# Patient Record
Sex: Male | Born: 1985 | Race: White | Hispanic: No | Marital: Married | Smoking: Never smoker
Health system: Southern US, Community
[De-identification: ages and names within clinical notes are randomized; demographics above are authoritative.]

## PROBLEM LIST (undated history)

## (undated) HISTORY — PX: APPENDECTOMY: SHX54

---
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
## 2006-01-28 IMAGING — CT CT ABDOMEN W/ CM
1 of 4 series · 14 of 32 positions shown, 19 images · IV contrast (omnipaque)
Comparison: none

CLINICAL DATA: The patient has nausea, vomiting, some abdominal pain.  Was operated on for ruptured appendix two weeks ago; now having diarrhea as well as nausea and vomiting.  
 CT ABDOMEN WITH CONTRAST
 After the intravenous injection of 150 ml Omnipaque 300, a series of scans through the entire abdomen is made and shows the lower lung fields to be normal.  The liver, gallbladder, hepatic radicles, common bile duct, pancreas and spleen appear normal.  The adrenal glands and kidneys appear normal.  No free fluid or air is seen within the abdomen.  The oral contrast is passed throughout the stomach and small bowel into the right colon.  Bones of the lower thoracic and upper lumbar spine are normal.
 IMPRESSION
 Normal CT scan of the abdomen with contrast.
 CT PELVIS WITH CONTRAST
 Utilizing the same contrast material as used for the abdominal scan, a series of scans of the pelvis are made and show rather marked thickening of the cecum.  Some areas are thickened up to 2 cm in maximum thickness.  No definite abscess, free fluid or air is seen within the pelvis but this focal colon thickening suggests a significant focal colitis in the region of the cecum.  The transverse and left colon do not appear to be significantly thickened.  The rectum and sigmoid show only minimal thickening.  The bladder, prostate and seminal vesicles appear normal.  The bones of the pelvis and lower lumbar spine are normal.
 Focal marked increase in thickening of the cecal wall, suggestive of focal colitis.  There is minimal thickening associated with the left and sigmoid colon.  No free fluid, air or focal abscess is seen.

[Series 2: abd/pelvis 5.0 b30f · axial · 0.59mm/px · z∈[-489,-84]mm · 14 of 91 slices shown, 19 images]
[im 5/91  soft-tissue]
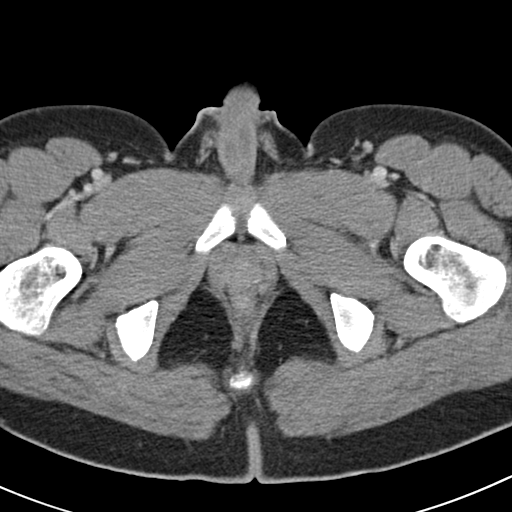
[im 5/91  bone]
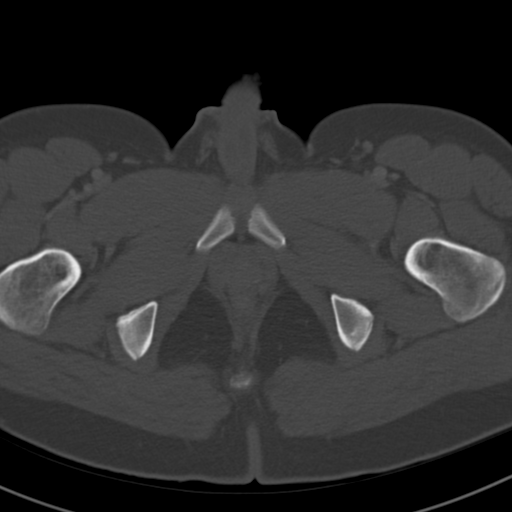
[im 15/91  soft-tissue]
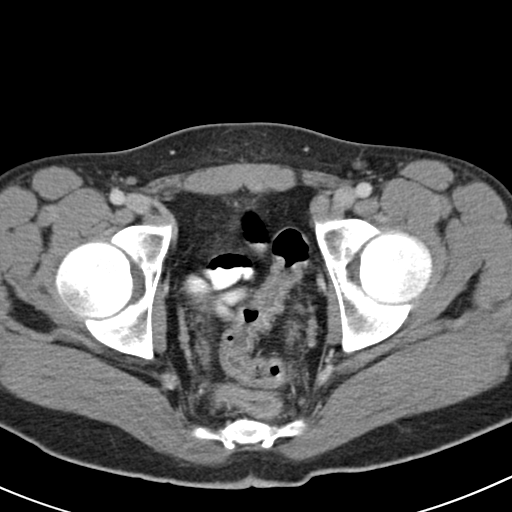
[im 19/91  soft-tissue]
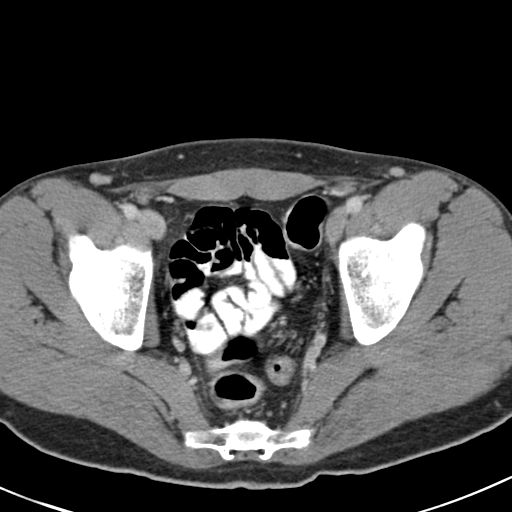
[im 24/91  soft-tissue]
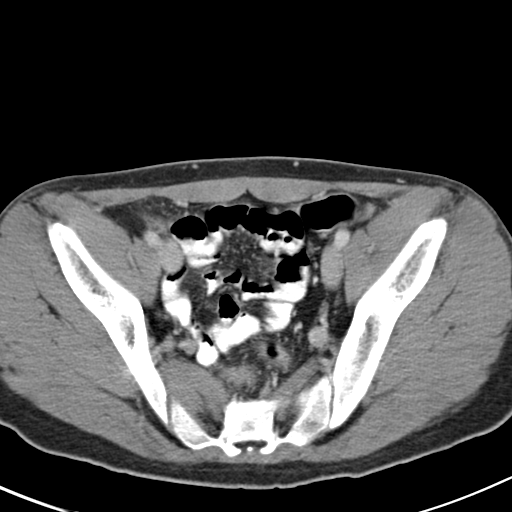
[im 34/91  soft-tissue]
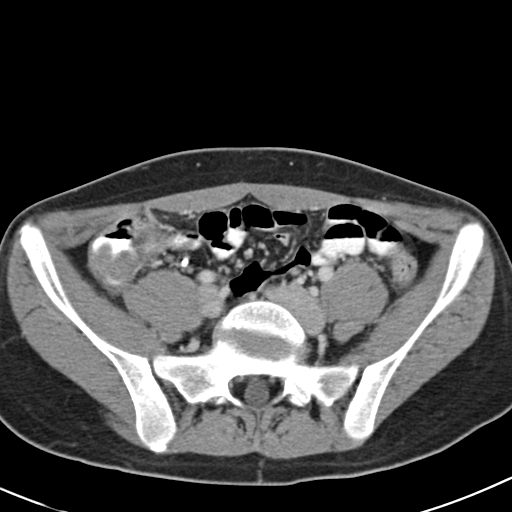
[im 38/91  soft-tissue]
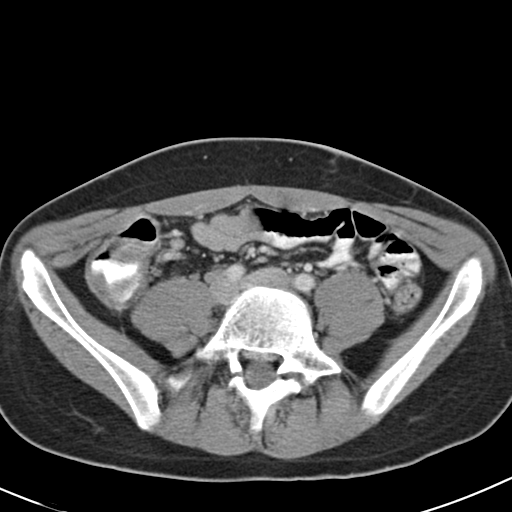
[im 48/91  soft-tissue]
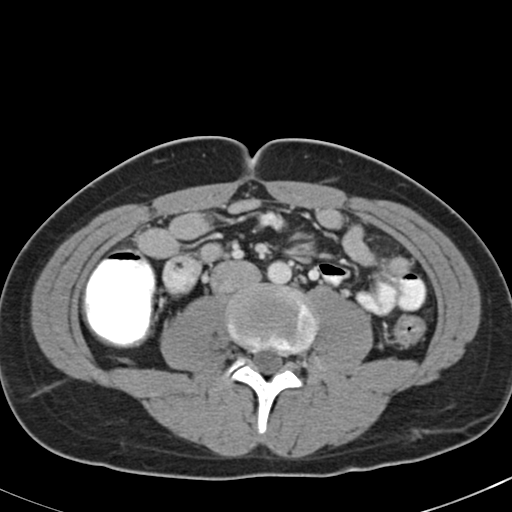
[im 53/91  soft-tissue]
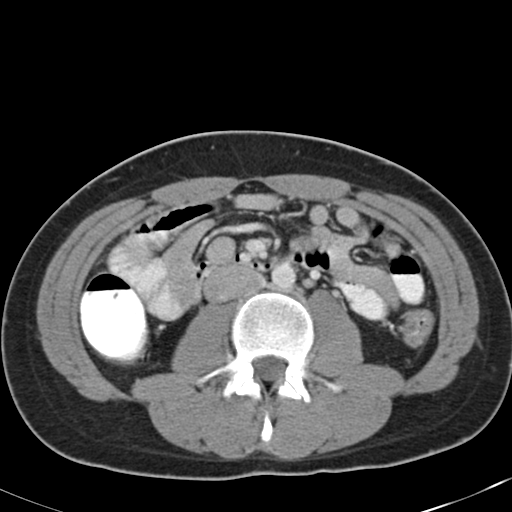
[im 57/91  soft-tissue]
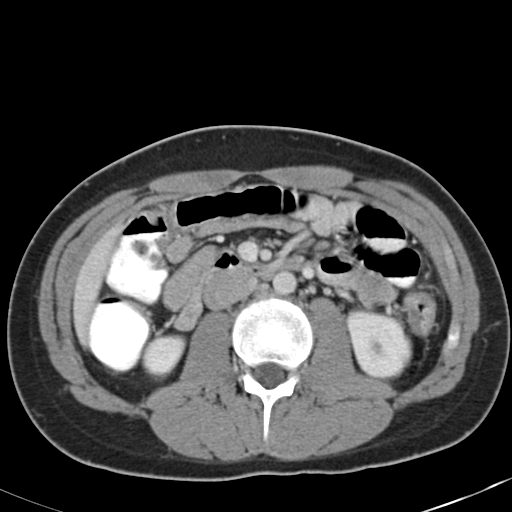
[im 57/91  bone]
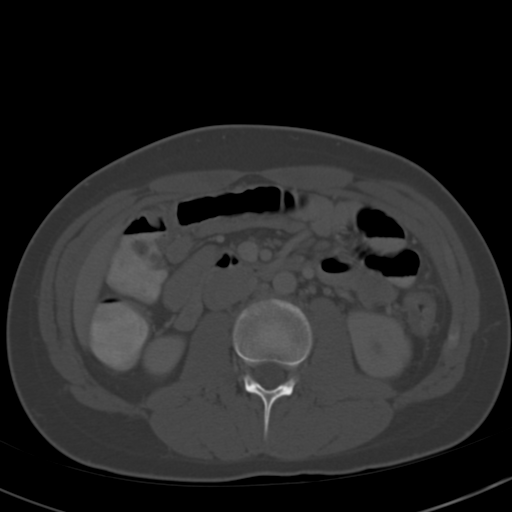
[im 67/91  soft-tissue]
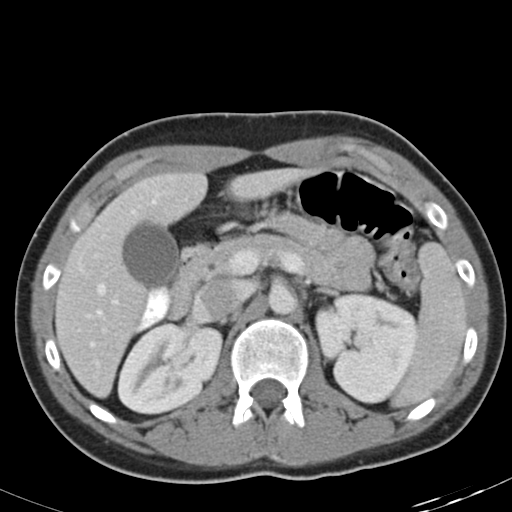
[im 72/91  soft-tissue]
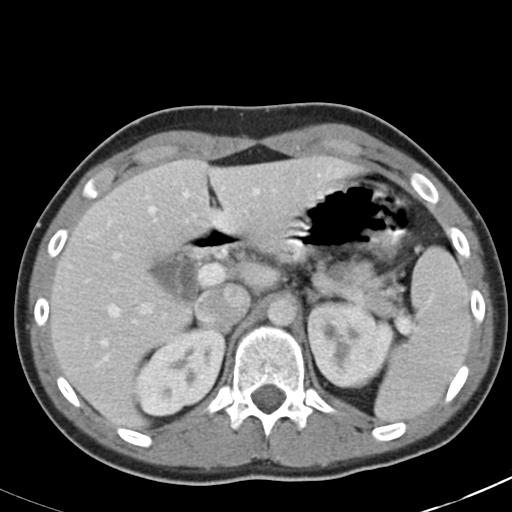
[im 72/91  lung]
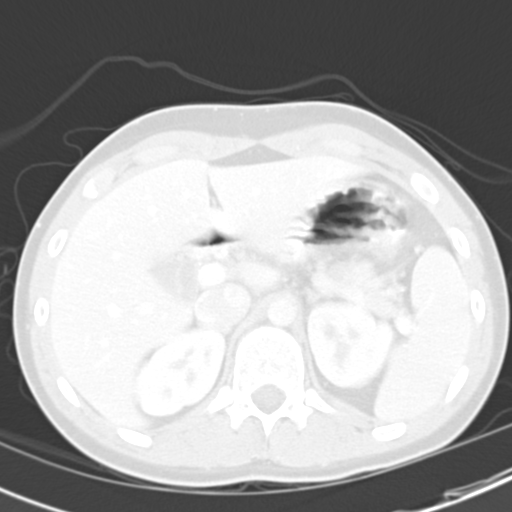
[im 76/91  soft-tissue]
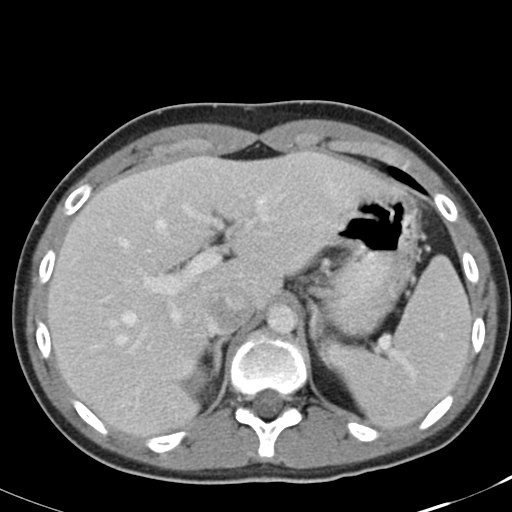
[im 76/91  lung]
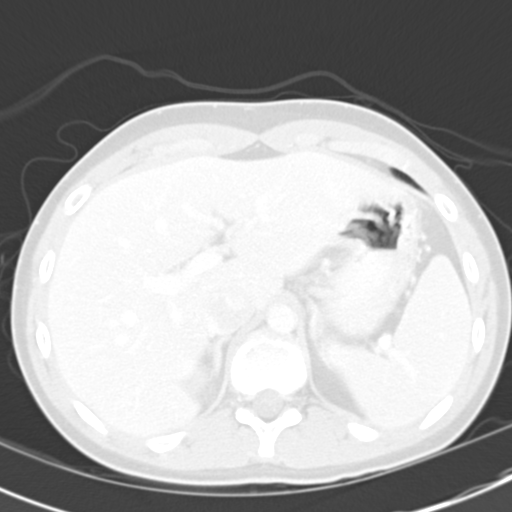
[im 81/91  lung]
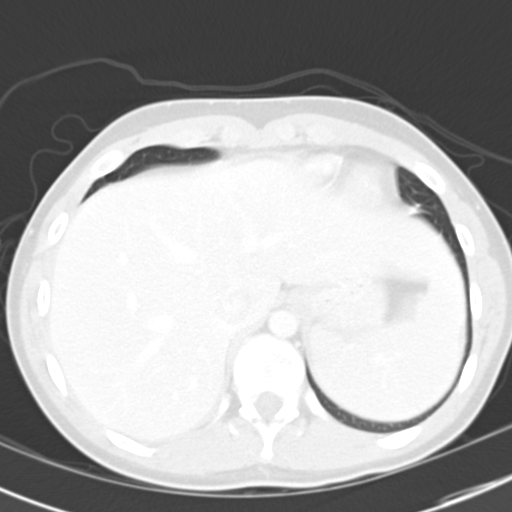
[im 86/91  soft-tissue]
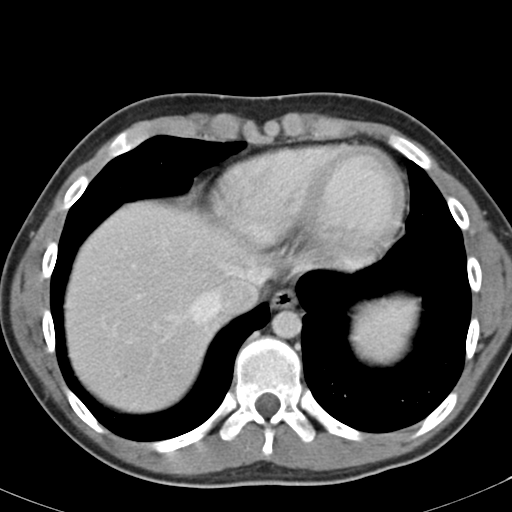
[im 86/91  lung]
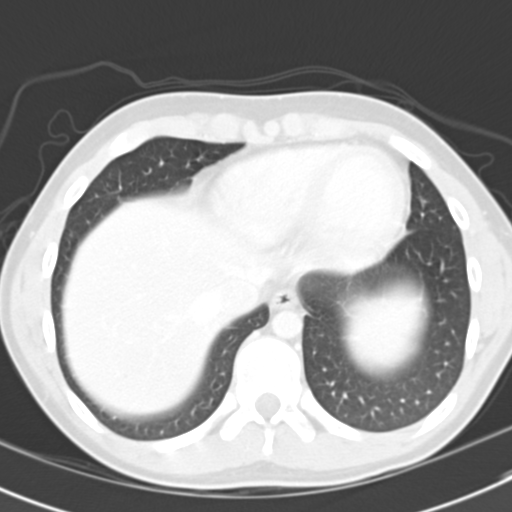

[14 of 32 positions shown; findings below may reference images not displayed]

## 2016-11-10 ENCOUNTER — Emergency Department (HOSPITAL_COMMUNITY)
Admission: EM | Admit: 2016-11-10 | Discharge: 2016-11-10 | Disposition: A | Discharge status: Home / Self Care | Payer: 59 | Attending: Emergency Medicine | Admitting: Emergency Medicine

## 2016-11-10 ENCOUNTER — Encounter (HOSPITAL_COMMUNITY): Payer: Self-pay

## 2016-11-10 DIAGNOSIS — R278 Other lack of coordination: Secondary | ICD-10-CM

## 2016-11-10 DIAGNOSIS — T887XXA Unspecified adverse effect of drug or medicament, initial encounter: Secondary | ICD-10-CM | POA: Insufficient documentation

## 2016-11-10 DIAGNOSIS — Y658 Other specified misadventures during surgical and medical care: Secondary | ICD-10-CM | POA: Diagnosis not present

## 2016-11-10 DIAGNOSIS — R270 Ataxia, unspecified: Secondary | ICD-10-CM | POA: Diagnosis present

## 2016-11-10 DIAGNOSIS — T50905A Adverse effect of unspecified drugs, medicaments and biological substances, initial encounter: Secondary | ICD-10-CM

## 2016-11-10 LAB — I-STAT CHEM 8, ED
BUN: 11 mg/dL (ref 6–20)
CALCIUM ION: 1.15 mmol/L (ref 1.15–1.40)
CHLORIDE: 101 mmol/L (ref 101–111)
Creatinine, Ser: 1 mg/dL (ref 0.61–1.24)
Glucose, Bld: 93 mg/dL (ref 65–99)
HEMATOCRIT: 45 % (ref 39.0–52.0)
Hemoglobin: 15.3 g/dL (ref 13.0–17.0)
Potassium: 4.3 mmol/L (ref 3.5–5.1)
SODIUM: 140 mmol/L (ref 135–145)
TCO2: 28 mmol/L (ref 0–100)

## 2016-11-10 LAB — COMPREHENSIVE METABOLIC PANEL
ALBUMIN: 4 g/dL (ref 3.5–5.0)
ALK PHOS: 59 U/L (ref 38–126)
ALT: 23 U/L (ref 17–63)
ANION GAP: 6 (ref 5–15)
AST: 18 U/L (ref 15–41)
BILIRUBIN TOTAL: 0.4 mg/dL (ref 0.3–1.2)
BUN: 12 mg/dL (ref 6–20)
CO2: 27 mmol/L (ref 22–32)
Calcium: 9.1 mg/dL (ref 8.9–10.3)
Chloride: 105 mmol/L (ref 101–111)
Creatinine, Ser: 0.99 mg/dL (ref 0.61–1.24)
GFR calc Af Amer: >60 mL/min (ref >60)
GFR calc non Af Amer: >60 mL/min (ref >60)
GLUCOSE: 97 mg/dL (ref 65–99)
POTASSIUM: 4.3 mmol/L (ref 3.5–5.1)
SODIUM: 138 mmol/L (ref 135–145)
TOTAL PROTEIN: 8 g/dL (ref 6.5–8.1)

## 2016-11-10 LAB — CBC WITH DIFFERENTIAL/PLATELET
Basophils Absolute: 0 10*3/uL (ref 0.0–0.1)
Basophils Relative: 0 %
EOS PCT: 3 %
Eosinophils Absolute: 0.2 10*3/uL (ref 0.0–0.7)
HEMATOCRIT: 42.7 % (ref 39.0–52.0)
Hemoglobin: 15.4 g/dL (ref 13.0–17.0)
LYMPHS ABS: 2.6 10*3/uL (ref 0.7–4.0)
LYMPHS PCT: 30 %
MCH: 30.3 pg (ref 26.0–34.0)
MCHC: 36.1 g/dL — AB (ref 30.0–36.0)
MCV: 84.1 fL (ref 78.0–100.0)
MONO ABS: 0.7 10*3/uL (ref 0.1–1.0)
MONOS PCT: 8 %
NEUTROS ABS: 5.2 10*3/uL (ref 1.7–7.7)
Neutrophils Relative %: 59 %
PLATELETS: 275 10*3/uL (ref 150–400)
RBC: 5.08 MIL/uL (ref 4.22–5.81)
RDW: 13.4 % (ref 11.5–15.5)
WBC: 8.7 10*3/uL (ref 4.0–10.5)

## 2016-11-10 NOTE — Discharge Instructions (Signed)
Please seek additional medical care if you have any worsening symptoms or have any additional concerns.  Please follow up with your doctor regarding today's ED visit.

## 2016-11-10 NOTE — ED Provider Notes (Signed)
[facility]-EMERGENCY DEPT Provider Note   CSN: [id number] Arrival date & time: 11/10/16  1712     History   Chief Complaint Chief Complaint  Patient presents with  . Medication Reaction    HPI Douglas Ortega is a 31 y.o. male who presents with weakness, feeling uncoordinated and reported abnormal eye movements after starting bactrim for a lump on his scrotum.  He reports that He started the medication on Saturday, stopped it on Sunday after he developed symptoms and has continued to experience symptoms since.  He reports that he does not have any neck or back pain.  Mild headache.  He denies passing out, n/v, rashes. He reports some mild tingling in the tips of his fingers bilaterally. He reports that in his legs his symptoms only occur from the knee down.    He reports that he has been told that there is a condition called "drunken limbs syndrome" that can be a rare complication from Bactrim. He believes he has this symptom and is concerned that he is not getting better.  He already has a new prescription for Keflex to treat his original resumed infection, however he states that he has not started that at this time.  He is present today with his wife who assists in providing history.  HPI  History reviewed. No pertinent past medical history.  There are no active problems to display for this patient.   No past surgical history on file.     Home Medications    Prior to Admission medications   Not on File    Family History History reviewed. No pertinent family history.  Social History Social History  Substance Use Topics  . Smoking status: Not on file  . Smokeless tobacco: Not on file  . Alcohol use Not on file     Allergies   Patient has no known allergies.   Review of Systems Review of Systems  Constitutional: Negative for chills and fever.  HENT: Negative for congestion, ear pain, nosebleeds and sore throat.   Eyes: Negative for pain and visual disturbance.    Respiratory: Negative for cough and shortness of breath.   Cardiovascular: Negative for chest pain and palpitations.  Gastrointestinal: Negative for abdominal pain, nausea and vomiting.  Genitourinary: Positive for scrotal swelling (Reports is getting better ). Negative for dysuria and hematuria.  Musculoskeletal: Positive for gait problem. Negative for arthralgias, back pain, joint swelling, neck pain and neck stiffness.  Skin: Negative for color change and rash.  Neurological: Positive for weakness (Arms and legs), numbness (Tips of fingers) and headaches. Negative for seizures and syncope.  All other systems reviewed and are negative.    Physical Exam Updated Vital Signs BP 132/84   Pulse 78   Temp 98.1 F (36.7 C) (Oral)   Resp 14   SpO2 96%   Physical Exam  Constitutional: He is oriented to person, place, and time. He appears well-developed and well-nourished.  HENT:  Head: Normocephalic and atraumatic.  Eyes: Conjunctivae are normal.  Neck: Neck supple.  Cardiovascular: Normal rate and regular rhythm.   No murmur heard. Pulmonary/Chest: Effort normal and breath sounds normal. No respiratory distress.  Abdominal: Soft. There is no tenderness.  Musculoskeletal: He exhibits no edema.  Neurological: He is alert and oriented to person, place, and time.  Mental Status:  Alert, oriented, thought content appropriate, able to give a coherent history. Speech fluent without evidence of aphasia. Able to follow 2 step commands without difficulty.  Cranial Nerves:  II:  Peripheral visual fields grossly normal, pupils equal, round, reactive to light III,IV, VI: ptosis not present, extra-ocular motions intact bilaterally , no abnormal nystagmus V,VII: smile symmetric, facial light touch sensation equal VIII: hearing grossly normal to voice  X: uvula elevates symmetrically  XI: bilateral shoulder shrug symmetric and strong XII: midline tongue extension without fassiculations Motor:  5/5 in upper and lower extremities bilaterally including strong and equal grip strength and dorsiflexion/plantar flexion Cerebellar: normal finger-to-nose with bilateral upper extremities, normal heel from toe to knee bilaterally, normal rapid alternating movements. Gait: gait is ataxic, more with right leg than left.  CV: distal pulses palpable throughout    Skin: Skin is warm and dry.  Psychiatric: He has a normal mood and affect.  Nursing note and vitals reviewed.   RN reports that she observed patient walking into the room without any obvious difficulties or ataxia.  ED Treatments / Results  Labs (all labs ordered are listed, but only abnormal results are displayed) Labs Reviewed  CBC WITH DIFFERENTIAL/PLATELET - Abnormal; Notable for the following:       Result Value   MCHC 36.1 (*)    All other components within normal limits  COMPREHENSIVE METABOLIC PANEL  I-STAT CHEM 8, ED    EKG  EKG Interpretation None       Radiology No results found.  Procedures Procedures (including critical care time)  Medications Ordered in ED Medications - No data to display   Initial Impression / Assessment and Plan / ED Course  I have reviewed the triage vital signs and the nursing notes.  Pertinent labs & imaging results that were available during my care of the patient were reviewed by me and considered in my medical decision making (see chart for details).    Marzetta MerinoBrandon K Witt presents with concern of continued reaction to Bactrim. He started taking Bactrim on Saturday for a potentially infected scrotum, and reports that Sunday he woke up at 3 AM feeling weak, uncoordinated. He states that he has stopped taking the medication however the symptoms continued.  On my exam he has no focal neuro deficits until asked to walk when he appears to have an ataxic gait. Nursing staff documented and reported that he has been observed walking multiple times without obvious staggering, stumbling,  with a normal gait. Due to reported weakness, baseline labs were obtained without evidence of renal dysfunction or electrolyte imbalance.   Dr. Criss AlvineGoldston was involved in the patient's care and I asked him to see the patient. After evaluating the patient he agreed with baseline labs, discharge with outpatient neurology follow-up the patient's symptoms persist.   At this time there does not appear to be any evidence of an acute emergency medical condition and the patient appears stable for discharge with appropriate outpatient follow up.Diagnosis was discussed with patient who verbalizes understanding and is agreeable to discharge. Pt case discussed with Dr. Criss AlvineGoldston who agrees with my plan.    Final Clinical Impressions(s) / ED Diagnoses   Final diagnoses:  Adverse effect of drug, initial encounter  Uncoordinated movements    New Prescriptions There are no discharge medications for this patient.    Cristina GongHammond, Kalifa Cadden W, PA-C 11/11/16 0301    Pricilla LovelessGoldston, Scott, MD 11/11/16 [phone]

## 2016-11-10 NOTE — ED Triage Notes (Addendum)
Pt reports starting a sulfa drug on Saturday that was prescribed at Urgent Care for chafed skin and a lump on his scrotum. He reports that the drug is making him feel weak and uncoordinated. He states that he stopped taking the medication on Sunday, but the symptoms continue. A&Ox4. Ambulatory. In no distress in triage. Pt took benadryl PTA.

## 2019-04-12 ENCOUNTER — Encounter: Payer: Self-pay | Admitting: Family Medicine

## 2019-04-12 ENCOUNTER — Ambulatory Visit: Payer: 59 | Admitting: Family Medicine

## 2019-04-12 ENCOUNTER — Other Ambulatory Visit: Payer: Self-pay

## 2019-04-12 VITALS — BP 110/72 | HR 87 | Temp 98.0°F | Ht 71.0 in | Wt 374.8 lb

## 2019-04-12 DIAGNOSIS — R0681 Apnea, not elsewhere classified: Secondary | ICD-10-CM

## 2019-04-12 DIAGNOSIS — K2100 Gastro-esophageal reflux disease with esophagitis, without bleeding: Secondary | ICD-10-CM

## 2019-04-12 DIAGNOSIS — R1110 Vomiting, unspecified: Secondary | ICD-10-CM | POA: Diagnosis not present

## 2019-04-12 DIAGNOSIS — Z0000 Encounter for general adult medical examination without abnormal findings: Secondary | ICD-10-CM | POA: Diagnosis not present

## 2019-04-12 DIAGNOSIS — Z6841 Body Mass Index (BMI) 40.0 and over, adult: Secondary | ICD-10-CM

## 2019-04-12 DIAGNOSIS — E559 Vitamin D deficiency, unspecified: Secondary | ICD-10-CM

## 2019-04-12 LAB — URINALYSIS, ROUTINE W REFLEX MICROSCOPIC
Bilirubin Urine: NEGATIVE
Hgb urine dipstick: NEGATIVE
Ketones, ur: NEGATIVE
Leukocytes,Ua: NEGATIVE
Nitrite: NEGATIVE
RBC / HPF: NONE SEEN (ref 0–?)
Specific Gravity, Urine: 1.02 (ref 1.000–1.030)
Total Protein, Urine: NEGATIVE
Urine Glucose: NEGATIVE
Urobilinogen, UA: 0.2 (ref 0.0–1.0)
WBC, UA: NONE SEEN (ref 0–?)
pH: 7.5 (ref 5.0–8.0)

## 2019-04-12 LAB — LDL CHOLESTEROL, DIRECT: Direct LDL: 103 mg/dL

## 2019-04-12 LAB — HEMOGLOBIN A1C: Hgb A1c MFr Bld: 4.9 % (ref 4.6–6.5)

## 2019-04-12 LAB — CBC
HCT: 42.9 % (ref 39.0–52.0)
Hemoglobin: 14.5 g/dL (ref 13.0–17.0)
MCHC: 33.8 g/dL (ref 30.0–36.0)
MCV: 84.4 fl (ref 78.0–100.0)
Platelets: 276 10*3/uL (ref 150.0–400.0)
RBC: 5.09 Mil/uL (ref 4.22–5.81)
RDW: 14 % (ref 11.5–15.5)
WBC: 8.4 10*3/uL (ref 4.0–10.5)

## 2019-04-12 LAB — LIPID PANEL
Cholesterol: 153 mg/dL (ref 0–200)
HDL: 34 mg/dL — ABNORMAL LOW (ref >39.00)
LDL Cholesterol: 101 mg/dL — ABNORMAL HIGH (ref 0–99)
NonHDL: 118.83
Total CHOL/HDL Ratio: 4
Triglycerides: 87 mg/dL (ref 0.0–149.0)
VLDL: 17.4 mg/dL (ref 0.0–40.0)

## 2019-04-12 LAB — COMPREHENSIVE METABOLIC PANEL
ALT: 28 U/L (ref 0–53)
AST: 18 U/L (ref 0–37)
Albumin: 3.9 g/dL (ref 3.5–5.2)
Alkaline Phosphatase: 70 U/L (ref 39–117)
BUN: 10 mg/dL (ref 6–23)
CO2: 30 mEq/L (ref 19–32)
Calcium: 9.1 mg/dL (ref 8.4–10.5)
Chloride: 103 mEq/L (ref 96–112)
Creatinine, Ser: 0.88 mg/dL (ref 0.40–1.50)
GFR: 100.1 mL/min (ref >60.00)
Glucose, Bld: 97 mg/dL (ref 70–99)
Potassium: 4.4 mEq/L (ref 3.5–5.1)
Sodium: 139 mEq/L (ref 135–145)
Total Bilirubin: 0.6 mg/dL (ref 0.2–1.2)
Total Protein: 7.4 g/dL (ref 6.0–8.3)

## 2019-04-12 LAB — VITAMIN D 25 HYDROXY (VIT D DEFICIENCY, FRACTURES): VITD: 21.23 ng/mL — ABNORMAL LOW (ref 30.00–100.00)

## 2019-04-12 LAB — TSH: TSH: 2.09 u[IU]/mL (ref 0.35–4.50)

## 2019-04-12 NOTE — Progress Notes (Addendum)
New Patient Office Visit  Subjective:  Patient ID: Douglas Ortega, male    DOB: 1986-03-21  Age: 33 y.o. MRN: [id number]  CC:  Chief Complaint  Patient presents with  . New Patient (Initial Visit)  . Nausea    for the past x 6 months  . Emesis    off and on for the past x 6 months   . Cough    unsure if it has to do with the vomtting     HPI Douglas Ortega presents for establishment of care, complete physical exam in follow-up of a significant health issue that he has been experiencing over the last year or so.  Patient reports nausea with dry heaves with most any kind of movement.  It can happen when he bends over to pick something up, is walking, carrying something or turning.  Specifically denies the sensation of spinning movement or lightheadedness.  The symptoms are worse in the morning when he arises.  He sometimes feels a sinking feeling in his chest.  He denies night sweats or weight loss.  History of reflux that is controlled with over-the-counter Nexium.  He does not smoke use illicit drugs and rarely drinks.  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  Patient has always been overweight but it is gotten worse more recently.  It is difficult for him to exercise with the above that he has been experiencing.  He lives with his wife and 48-year-old daughter.  They are expecting a baby any day now.  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  Denies particular stress or depression in his life.  Admits to snoring and does not feel rested in the morning.  Father's health history is unknown.  Mom is in her early 67s and has some type of heart arrhythmia.  History reviewed. No pertinent past medical history.    History reviewed. No pertinent family history.  Social History   Socioeconomic History    . Marital status: Married    Spouse name: Not on file  . Number of children: Not on file  . Years of education: Not on file  . Highest education level: Not on file  Occupational History  . Not on file  Tobacco Use  . Smoking status: Never Smoker  . Smokeless tobacco: Never Used  Substance and Sexual Activity  . Alcohol use: Yes    Comment: x 1 month   . Drug use: Never  . Sexual activity: Yes  Other Topics Concern  . Not on file  Social History Narrative  . Not on file   Social Determinants of Health   Financial Resource Strain:   . Difficulty of Paying Living Expenses: Not on file  Food Insecurity:   . Worried About Charity fundraiser in the Last Year: Not on file  . Ran Out of Food in the Last Year: Not on file  Transportation Needs:   . Lack of Transportation (Medical): Not on file  . Lack of Transportation (Non-Medical): Not on file  Physical Activity:   . Days of Exercise per Week: Not on file  . Minutes of Exercise per Session: Not on file  Stress:   . Feeling of Stress : Not on file  Social Connections:   . Frequency of Communication with Friends and  Family: Not on file  . Frequency of Social Gatherings with Friends and Family: Not on file  . Attends Religious Services: Not on file  . Active Member of Clubs or Organizations: Not on file  . Attends Archivist Meetings: Not on file  . Marital Status: Not on file  Intimate Partner Violence:   . Fear of Current or Ex-Partner: Not on file  . Emotionally Abused: Not on file  . Physically Abused: Not on file  . Sexually Abused: Not on file    ROS Review of Systems  Constitutional: Negative for chills, diaphoresis, fatigue, fever and unexpected weight change.  HENT: Negative.   Eyes: Negative for photophobia and visual disturbance.  Respiratory: Positive for apnea. Negative for chest tightness, shortness of breath and wheezing.   Cardiovascular: Negative for chest pain, palpitations and leg swelling.   Gastrointestinal: Positive for nausea and vomiting. Negative for abdominal pain, anal bleeding, blood in stool, constipation, diarrhea and rectal pain.  Endocrine: Negative.   Genitourinary: Negative for difficulty urinating and frequency.  Musculoskeletal: Negative.   Skin: Negative for pallor and rash.  Allergic/Immunologic: Negative for immunocompromised state.  Neurological: Negative for dizziness, light-headedness, numbness and headaches.  Hematological: Does not bruise/bleed easily.  Psychiatric/Behavioral: Negative.     Objective:   Today's Vitals: BP 110/72   Pulse 87   Temp 98 F (36.7 C)   Ht 5' 11"  (1.803 m)   Wt (!) 374 lb 12.8 oz (170 kg)   SpO2 97%   BMI 52.27 kg/m   Physical Exam Constitutional:      General: He is not in acute distress.    Appearance: Normal appearance. He is obese. He is not ill-appearing, toxic-appearing or diaphoretic.  HENT:     Head: Normocephalic and atraumatic.     Right Ear: Tympanic membrane, ear canal and external ear normal. There is no impacted cerumen.     Left Ear: Tympanic membrane, ear canal and external ear normal. There is no impacted cerumen.     Nose: No congestion or rhinorrhea.     Mouth/Throat:     Mouth: Mucous membranes are moist.     Pharynx: Oropharynx is clear. No oropharyngeal exudate or posterior oropharyngeal erythema.  Eyes:     General: No scleral icterus.       Right eye: No discharge.        Left eye: No discharge.     Extraocular Movements: Extraocular movements intact.     Conjunctiva/sclera: Conjunctivae normal.     Pupils: Pupils are equal, round, and reactive to light.  Cardiovascular:     Rate and Rhythm: Normal rate and regular rhythm.  Pulmonary:     Effort: Pulmonary effort is normal.     Breath sounds: Normal breath sounds.  Abdominal:     General: Bowel sounds are normal. There is no distension.     Palpations: Abdomen is soft. There is no mass.     Tenderness: There is no abdominal  tenderness. There is no guarding or rebound.     Hernia: No hernia is present. There is no hernia in the left inguinal area or right inguinal area.  Genitourinary:    Penis: Normal. No tenderness, discharge or swelling.      Testes:        Right: Mass, tenderness or swelling not present. Right testis is descended.        Left: Mass, tenderness or swelling not present. Left testis is descended.  Musculoskeletal:  General: No tenderness or deformity.     Cervical back: No rigidity or tenderness.  Lymphadenopathy:     Cervical: No cervical adenopathy.     Lower Body: No right inguinal adenopathy. No left inguinal adenopathy.  Skin:    General: Skin is warm and dry.  Neurological:     Mental Status: He is alert and oriented to person, place, and time.  Psychiatric:        Mood and Affect: Mood normal.        Behavior: Behavior normal.     Assessment & Plan:   Problem List Items Addressed This Visit      Digestive   Gastroesophageal reflux disease with esophagitis     Other   Apnea - Primary   Relevant Orders   Ambulatory referral to Sleep Studies   Dry heaves   Relevant Orders   Ambulatory referral to Neurology   Class 3 severe obesity due to excess calories with body mass index (BMI) of 50.0 to 59.9 in adult [facility])   Relevant Orders   VITAMIN D 25 Hydroxy (Vit-D Deficiency, Fractures) (Completed)   Amb ref to [place] maintenance   Relevant Orders   CBC (Completed)   Comp Met (CMET) (Completed)   Direct LDL (Completed)   HgB A1c (Completed)   Lipid Profile (Completed)   TSH (Completed)   Urinalysis, Routine w reflex microscopic (Completed)   HIV antibody (with reflex) (Completed)   Vitamin D deficiency   Relevant Medications   Cholecalciferol (VITAMIN D3) 75 MCG (3000 UT) TABS      Outpatient Encounter Medications as of 04/12/2019  Medication Sig  . Esomeprazole Magnesium (NEXIUM PO) Take by mouth.  . Cholecalciferol  (VITAMIN D3) 75 MCG (3000 UT) TABS Take one daily   No facility-administered encounter medications on file as of 04/12/2019.    Follow-up: Return in about 3 months (around 07/11/2019).   Libby Maw, MD

## 2019-04-13 DIAGNOSIS — E559 Vitamin D deficiency, unspecified: Secondary | ICD-10-CM | POA: Insufficient documentation

## 2019-04-13 LAB — HIV ANTIBODY (ROUTINE TESTING W REFLEX): HIV 1&2 Ab, 4th Generation: NONREACTIVE

## 2019-04-13 MED ORDER — VITAMIN D3 75 MCG (3000 UT) PO TABS: ORAL_TABLET | ORAL | 1 refills | Status: AC

## 2019-04-13 NOTE — Addendum Note (Signed)
Addended by: Jon Billings on: 04/13/2019 11:31 AM   Modules accepted: Orders

## 2019-04-13 NOTE — Progress Notes (Signed)
Labs were okay but Vit D was low. Treating may help some with weight loss. Have sent a Rx to the pharmacy. We willrecheck on follow up.

## 2019-05-18 ENCOUNTER — Ambulatory Visit: Payer: 59 | Admitting: Registered"

## 2019-05-31 ENCOUNTER — Ambulatory Visit: Payer: 59 | Admitting: Dietician

## 2019-06-21 ENCOUNTER — Ambulatory Visit: Payer: 59 | Admitting: Dietician

## 2019-07-11 ENCOUNTER — Ambulatory Visit: Payer: 59 | Admitting: Family Medicine

## 2019-07-11 DIAGNOSIS — Z0289 Encounter for other administrative examinations: Secondary | ICD-10-CM

## 2020-03-16 ENCOUNTER — Ambulatory Visit: Payer: 59 | Attending: Internal Medicine

## 2020-03-16 DIAGNOSIS — Z23 Encounter for immunization: Secondary | ICD-10-CM

## 2020-03-16 NOTE — Progress Notes (Signed)
° °  Covid-19 Vaccination Clinic  Name:  Douglas Ortega    MRN: [id number] DOB: 08-11-1985  03/16/2020  [name] was observed post Covid-19 immunization for 15 minutes without incident. He was provided with Vaccine Information Sheet and instruction to access the V-Safe system.   [name] was instructed to call 911 with any severe reactions post vaccine:  Difficulty breathing   Swelling of face and throat   A fast heartbeat   A bad rash all over body   Dizziness and weakness   Immunizations Administered    Name Date Dose VIS Date Route   Pfizer COVID-19 Vaccine 03/16/2020  3:53 PM 0.3 mL 02/15/2020 Intramuscular   Manufacturer: [employer], [employer]   Lot: [id number]   NDC: [address]-3
